# Patient Record
Sex: Male | Born: 1985 | Race: White | Hispanic: No | Marital: Married | Smoking: Current every day smoker
Health system: Southern US, Community
[De-identification: ages and names within clinical notes are randomized; demographics above are authoritative.]

## PROBLEM LIST (undated history)

## (undated) DIAGNOSIS — N200 Calculus of kidney: Secondary | ICD-10-CM

## (undated) DIAGNOSIS — G43909 Migraine, unspecified, not intractable, without status migrainosus: Secondary | ICD-10-CM

## (undated) HISTORY — DX: Migraine, unspecified, not intractable, without status migrainosus: G43.909

## (undated) HISTORY — PX: KNEE SURGERY: SHX244

## (undated) HISTORY — PX: CLAVICLE SURGERY: SHX598

---

## 2007-08-27 ENCOUNTER — Emergency Department: Payer: Self-pay | Admitting: Emergency Medicine

## 2007-09-01 ENCOUNTER — Ambulatory Visit: Payer: Self-pay | Admitting: Unknown Physician Specialty

## 2007-09-05 ENCOUNTER — Ambulatory Visit: Payer: Self-pay | Admitting: Unknown Physician Specialty

## 2010-03-27 ENCOUNTER — Ambulatory Visit: Payer: Self-pay

## 2010-12-16 ENCOUNTER — Ambulatory Visit: Payer: Self-pay | Admitting: Specialist

## 2013-01-14 ENCOUNTER — Emergency Department: Payer: Self-pay | Admitting: Emergency Medicine

## 2013-01-14 LAB — URINALYSIS, COMPLETE
Glucose,UR: 50 mg/dL (ref 0–75)
Ketone: NEGATIVE
Nitrite: NEGATIVE
Protein: NEGATIVE
RBC,UR: 9 /HPF (ref 0–5)
Specific Gravity: 1.015 (ref 1.003–1.030)
Squamous Epithelial: NONE SEEN
WBC UR: 152 /HPF (ref 0–5)

## 2013-01-14 LAB — CBC WITH DIFFERENTIAL/PLATELET
Basophil %: 0.2 %
Eosinophil #: 0 10*3/uL (ref 0.0–0.7)
Eosinophil %: 0.2 %
HGB: 13.9 g/dL (ref 13.0–18.0)
Lymphocyte %: 7.6 %
MCH: 30.5 pg (ref 26.0–34.0)
MCHC: 33.7 g/dL (ref 32.0–36.0)
Neutrophil #: 14.4 10*3/uL — ABNORMAL HIGH (ref 1.4–6.5)
RDW: 13.4 % (ref 11.5–14.5)
WBC: 17.1 10*3/uL — ABNORMAL HIGH (ref 3.8–10.6)

## 2013-01-14 LAB — BASIC METABOLIC PANEL
Calcium, Total: 9.2 mg/dL (ref 8.5–10.1)
Chloride: 102 mmol/L (ref 98–107)
Co2: 30 mmol/L (ref 21–32)
Creatinine: 0.79 mg/dL (ref 0.60–1.30)
EGFR (African American): >60
EGFR (Non-African Amer.): >60
Glucose: 92 mg/dL (ref 65–99)
Osmolality: 268 (ref 275–301)
Sodium: 135 mmol/L — ABNORMAL LOW (ref 136–145)

## 2013-01-14 LAB — GC/CHLAMYDIA PROBE AMP

## 2013-01-16 LAB — URINE CULTURE

## 2013-04-13 ENCOUNTER — Ambulatory Visit (INDEPENDENT_AMBULATORY_CARE_PROVIDER_SITE_OTHER): Payer: PRIVATE HEALTH INSURANCE

## 2013-04-13 ENCOUNTER — Encounter: Payer: Self-pay | Admitting: Podiatry

## 2013-04-13 ENCOUNTER — Ambulatory Visit (INDEPENDENT_AMBULATORY_CARE_PROVIDER_SITE_OTHER): Payer: PRIVATE HEALTH INSURANCE | Admitting: Podiatry

## 2013-04-13 VITALS — BP 130/76 | HR 90 | Resp 16 | Ht 77.0 in | Wt 190.0 lb

## 2013-04-13 DIAGNOSIS — M79609 Pain in unspecified limb: Secondary | ICD-10-CM

## 2013-04-13 DIAGNOSIS — M79676 Pain in unspecified toe(s): Secondary | ICD-10-CM

## 2013-04-13 DIAGNOSIS — M779 Enthesopathy, unspecified: Secondary | ICD-10-CM

## 2013-04-13 NOTE — Progress Notes (Signed)
   Subjective:    Patient ID: Isaiah Lewis, male    DOB: [birth year], 28 y.o.   MRN: [id number]  HPI Comments: This pinky toe left foot. Would act up on and off , and it has a knot on toe . Last week was red and swollen. It has gotten a little better  Toe Pain       Review of Systems  All other systems reviewed and are negative.       Objective:   Physical Exam: I have reviewed his past medical history medications allergies surgeries social history and review of systems. He does have a family history of gout. Pulses are palpable bilateral. Neurologic sensorium is intact bilateral. Deep tendon reflexes are brisk and equal bilateral. Muscle strength is 5 over 5 dorsiflexors plantar flexors inverters everters all intrinsic musculature is intact. Orthopedic evaluation demonstrates a mildly edematous fifth digit of the left foot with tenderness on palpation of the lateral and plantar lateral aspect of the fifth PIPJ left. Radiographic evaluation does demonstrate a soft tissue increase in density at the lateral and plantar lateral PIPJ possibly associated with gout or bursitis.        Assessment & Plan:  Assessment: Gout or bursitis fifth digit of the left foot.  Plan: I offered him an injection today but since it has been getting better he says he rather not take the injection. I will followup with him an as-needed basis. Should he come in complaining of a wrist swollen toe an arthritic profile will be performed.

## 2015-04-03 ENCOUNTER — Emergency Department: Payer: PRIVATE HEALTH INSURANCE

## 2015-04-03 ENCOUNTER — Emergency Department
Admission: EM | Admit: 2015-04-03 | Discharge: 2015-04-03 | Disposition: A | Discharge status: Home / Self Care | Payer: PRIVATE HEALTH INSURANCE | Attending: Emergency Medicine | Admitting: Emergency Medicine

## 2015-04-03 DIAGNOSIS — N200 Calculus of kidney: Secondary | ICD-10-CM | POA: Diagnosis not present

## 2015-04-03 DIAGNOSIS — F17210 Nicotine dependence, cigarettes, uncomplicated: Secondary | ICD-10-CM | POA: Insufficient documentation

## 2015-04-03 DIAGNOSIS — R079 Chest pain, unspecified: Secondary | ICD-10-CM | POA: Insufficient documentation

## 2015-04-03 DIAGNOSIS — R109 Unspecified abdominal pain: Secondary | ICD-10-CM | POA: Diagnosis present

## 2015-04-03 LAB — URINALYSIS COMPLETE WITH MICROSCOPIC (ARMC ONLY)
BILIRUBIN URINE: NEGATIVE
Glucose, UA: 50 mg/dL — AB
Ketones, ur: NEGATIVE mg/dL
LEUKOCYTES UA: NEGATIVE
Nitrite: NEGATIVE
PH: 5 (ref 5.0–8.0)
Protein, ur: NEGATIVE mg/dL
RBC / HPF: NONE SEEN RBC/hpf (ref 0–5)
SPECIFIC GRAVITY, URINE: 1.027 (ref 1.005–1.030)
WBC UA: NONE SEEN WBC/hpf (ref 0–5)

## 2015-04-03 LAB — COMPREHENSIVE METABOLIC PANEL
ALBUMIN: 4.7 g/dL (ref 3.5–5.0)
ALT: 36 U/L (ref 17–63)
AST: 36 U/L (ref 15–41)
Alkaline Phosphatase: 77 U/L (ref 38–126)
Anion gap: 9 (ref 5–15)
BUN: 14 mg/dL (ref 6–20)
CHLORIDE: 107 mmol/L (ref 101–111)
CO2: 25 mmol/L (ref 22–32)
Calcium: 9.2 mg/dL (ref 8.9–10.3)
Creatinine, Ser: 0.94 mg/dL (ref 0.61–1.24)
GFR calc Af Amer: >60 mL/min (ref >60)
GFR calc non Af Amer: >60 mL/min (ref >60)
GLUCOSE: 125 mg/dL — AB (ref 65–99)
POTASSIUM: 3.9 mmol/L (ref 3.5–5.1)
Sodium: 141 mmol/L (ref 135–145)
Total Bilirubin: 0.7 mg/dL (ref 0.3–1.2)
Total Protein: 7.3 g/dL (ref 6.5–8.1)

## 2015-04-03 LAB — CBC
HCT: 45.4 % (ref 40.0–52.0)
Hemoglobin: 15.7 g/dL (ref 13.0–18.0)
MCH: 31.5 pg (ref 26.0–34.0)
MCHC: 34.6 g/dL (ref 32.0–36.0)
MCV: 90.9 fL (ref 80.0–100.0)
PLATELETS: 257 10*3/uL (ref 150–440)
RBC: 5 MIL/uL (ref 4.40–5.90)
RDW: 13.4 % (ref 11.5–14.5)
WBC: 6.6 10*3/uL (ref 3.8–10.6)

## 2015-04-03 MED ORDER — KETOROLAC TROMETHAMINE 30 MG/ML IJ SOLN
30.0000 mg | Freq: Once | INTRAMUSCULAR | Status: AC
  Administered 2015-04-03: 30 mg via INTRAVENOUS
  Filled 2015-04-03: qty 1

## 2015-04-03 MED ORDER — ONDANSETRON HCL 4 MG/2ML IJ SOLN
INTRAMUSCULAR | Status: AC
  Filled 2015-04-03: qty 2

## 2015-04-03 MED ORDER — ONDANSETRON HCL 4 MG/2ML IJ SOLN
4.0000 mg | Freq: Once | INTRAMUSCULAR | Status: AC
  Administered 2015-04-03: 4 mg via INTRAVENOUS

## 2015-04-03 MED ORDER — MORPHINE SULFATE (PF) 4 MG/ML IV SOLN
4.0000 mg | Freq: Once | INTRAVENOUS | Status: AC
  Administered 2015-04-03: 4 mg via INTRAVENOUS

## 2015-04-03 MED ORDER — TAMSULOSIN HCL 0.4 MG PO CAPS: 0.4000 mg | ORAL_CAPSULE | Freq: Every day | ORAL | Status: AC

## 2015-04-03 MED ORDER — ONDANSETRON HCL 4 MG/2ML IJ SOLN
INTRAMUSCULAR | Status: AC
  Administered 2015-04-03: 4 mg via INTRAVENOUS
  Filled 2015-04-03: qty 2

## 2015-04-03 MED ORDER — OXYCODONE-ACETAMINOPHEN 5-325 MG PO TABS
1.0000 | ORAL_TABLET | Freq: Once | ORAL | Status: AC
  Administered 2015-04-03: 1 via ORAL
  Filled 2015-04-03: qty 1

## 2015-04-03 MED ORDER — OXYCODONE-ACETAMINOPHEN 5-325 MG PO TABS: 1.0000 | ORAL_TABLET | Freq: Four times a day (QID) | ORAL | Status: AC | PRN

## 2015-04-03 MED ORDER — SODIUM CHLORIDE 0.9 % IV BOLUS (SEPSIS)
1000.0000 mL | Freq: Once | INTRAVENOUS | Status: AC
  Administered 2015-04-03: 1000 mL via INTRAVENOUS

## 2015-04-03 MED ORDER — MORPHINE SULFATE (PF) 4 MG/ML IV SOLN
INTRAVENOUS | Status: AC
  Administered 2015-04-03: 4 mg via INTRAVENOUS
  Filled 2015-04-03: qty 1

## 2015-04-03 MED ORDER — ONDANSETRON 4 MG PO TBDP: 4.0000 mg | ORAL_TABLET | Freq: Three times a day (TID) | ORAL | Status: AC | PRN

## 2015-04-03 NOTE — Discharge Instructions (Signed)
Kidney Stones °Kidney stones (urolithiasis) are deposits that form inside your kidneys. The intense pain is caused by the stone moving through the urinary tract. When the stone moves, the ureter goes into spasm around the stone. The stone is usually passed in the urine.  °CAUSES  °· A disorder that makes certain neck glands produce too much parathyroid hormone (primary hyperparathyroidism). °· A buildup of uric acid crystals, similar to gout in your joints. °· Narrowing (stricture) of the ureter. °· A kidney obstruction present at birth (congenital obstruction). °· Previous surgery on the kidney or ureters. °· Numerous kidney infections. °SYMPTOMS  °· Feeling sick to your stomach (nauseous). °· Throwing up (vomiting). °· Blood in the urine (hematuria). °· Pain that usually spreads (radiates) to the groin. °· Frequency or urgency of urination. °DIAGNOSIS  °· Taking a history and physical exam. °· Blood or urine tests. °· CT scan. °· Occasionally, an examination of the inside of the urinary bladder (cystoscopy) is performed. °TREATMENT  °· Observation. °· Increasing your fluid intake. °· Extracorporeal shock wave lithotripsy--This is a noninvasive procedure that uses shock waves to break up kidney stones. °· Surgery may be needed if you have severe pain or persistent obstruction. There are various surgical procedures. Most of the procedures are performed with the use of small instruments. Only small incisions are needed to accommodate these instruments, so recovery time is minimized. °The size, location, and chemical composition are all important variables that will determine the proper choice of action for you. Talk to your health care provider to better understand your situation so that you will minimize the risk of injury to yourself and your kidney.  °HOME CARE INSTRUCTIONS  °· Drink enough water and fluids to keep your urine clear or pale yellow. This will help you to pass the stone or stone fragments. °· Strain  all urine through the provided strainer. Keep all particulate matter and stones for your health care provider to see. The stone causing the pain may be as small as a grain of salt. It is very important to use the strainer each and every time you pass your urine. The collection of your stone will allow your health care provider to analyze it and verify that a stone has actually passed. The stone analysis will often identify what you can do to reduce the incidence of recurrences. °· Only take over-the-counter or prescription medicines for pain, discomfort, or fever as directed by your health care provider. °· Keep all follow-up visits as told by your health care provider. This is important. °· Get follow-up X-rays if required. The absence of pain does not always mean that the stone has passed. It may have only stopped moving. If the urine remains completely obstructed, it can cause loss of kidney function or even complete destruction of the kidney. It is your responsibility to make sure X-rays and follow-ups are completed. Ultrasounds of the kidney can show blockages and the status of the kidney. Ultrasounds are not associated with any radiation and can be performed easily in a matter of minutes. °· Make changes to your daily diet as told by your health care provider. You may be told to: °¨ Limit the amount of salt that you eat. °¨ Eat 5 or more servings of fruits and vegetables each day. °¨ Limit the amount of meat, poultry, fish, and eggs that you eat. °· Collect a 24-hour urine sample as told by your health care provider. You may need to collect another urine sample every 6-12   months. °SEEK MEDICAL CARE IF: °· You experience pain that is progressive and unresponsive to any pain medicine you have been prescribed. °SEEK IMMEDIATE MEDICAL CARE IF:  °· Pain cannot be controlled with the prescribed medicine. °· You have a fever or shaking chills. °· The severity or intensity of pain increases over 18 hours and is not  relieved by pain medicine. °· You develop a new onset of abdominal pain. °· You feel faint or pass out. °· You are unable to urinate. °  °This information is not intended to replace advice given to you by your health care provider. Make sure you discuss any questions you have with your health care provider. °  °Document Released: 01/19/2005 Document Revised: 10/10/2014 Document Reviewed: 06/22/2012 °Elsevier Interactive Patient Education ©2016 [employer] ° °Renal Colic °Renal colic is pain that is caused by passing a kidney stone. The pain can be sharp and severe. It may be felt in the back, abdomen, side (flank), or groin. It can cause nausea. Renal colic can come and go. °HOME CARE INSTRUCTIONS °Watch your condition for any changes. The following actions may help to lessen any discomfort that you are feeling: °· Take medicines only as directed by your health care provider. °· Ask your health care provider if it is okay to take over-the-counter pain medicine. °· Drink enough fluid to keep your urine clear or pale yellow. Drink 6-8 glasses of water each day. °· Limit the amount of salt that you eat to less than 2 grams per day. °· Reduce the amount of protein in your diet. Eat less meat, fish, nuts, and dairy. °· Avoid foods such as spinach, rhubarb, nuts, or bran. These may make kidney stones more likely to form. °SEEK MEDICAL CARE IF: °· You have a fever or chills. °· Your urine smells bad or looks cloudy. °· You have pain or burning when you pass urine. °SEEK IMMEDIATE MEDICAL CARE IF: °· Your flank pain or groin pain suddenly worsens. °· You become confused or disoriented or you lose consciousness. °  °This information is not intended to replace advice given to you by your health care provider. Make sure you discuss any questions you have with your health care provider. °  °Document Released: 10/29/2004 Document Revised: 02/09/2014 Document Reviewed: 11/29/2013 °Elsevier Interactive Patient Education ©2016  [employer] ° °

## 2015-04-03 NOTE — ED Provider Notes (Signed)
[facility] Emergency Department Provider Note  ____________________________________________  Time seen: Approximately 639 AM  I have reviewed the triage vital signs and the nursing notes.   HISTORY  Chief Complaint Flank Pain    HPI Isaiah Lewis is a 30 y.o. male who comes into the hospital with some right flank pain. The patient reports he woke up with sharp pain in his right flank and the pain is moving into his groin. The patient denies any blood in his urine or dysuria. According to the patient's significant other he was vomiting as well. The patient has never had this pain before. A year ago he did have prostatitis which resulted in some pain in his groin. He did not have pain in his back. The patient rates his pain 8-9 out of 10 in intensity. He did not take anything for pain at home. The patient reports that on his way here he felt as though he had some chest discomfort as well. The patient was concerned and very uncomfortable she came in for evaluation.   Past Medical History  Diagnosis Date  . Migraines     There are no active problems to display for this patient.   No past surgical history on file.  Current Outpatient Rx  Name  Route  Sig  Dispense  Refill  . ondansetron (ZOFRAN ODT) 4 MG disintegrating tablet   Oral   Take 1 tablet (4 mg total) by mouth every 8 (eight) hours as needed for nausea or vomiting.   20 tablet   0   . oxyCODONE-acetaminophen (ROXICET) 5-325 MG tablet   Oral   Take 1 tablet by mouth every 6 (six) hours as needed.   12 tablet   0   . SUMAtriptan (IMITREX) 100 MG tablet   Oral   Take 100 mg by mouth as needed for migraine or headache. May repeat in 2 hours if headache persists or recurs.         . tamsulosin (FLOMAX) 0.4 MG CAPS capsule   Oral   Take 1 capsule (0.4 mg total) by mouth daily.   7 capsule   0     Allergies Tylenol with codeine #3  No family history on file.  Social  History Social History  Substance Use Topics  . Smoking status: Current Some Day Smoker    Types: Cigarettes  . Smokeless tobacco: Current User  . Alcohol Use: No    Review of Systems Constitutional: No fever/chills Eyes: No visual changes. ENT: No sore throat. Cardiovascular:  chest pain. Respiratory: Denies shortness of breath. Gastrointestinal: Abdominal pain with nausea and vomiting Genitourinary: Negative for dysuria. Musculoskeletal: Right flank pain Skin: Negative for rash. Neurological: Negative for headaches, focal weakness or numbness.  10-point ROS otherwise negative.  ____________________________________________   PHYSICAL EXAM:  VITAL SIGNS: ED Triage Vitals  Enc Vitals Group     BP 04/03/15 0629 109/57 mmHg     Pulse Rate 04/03/15 0629 96     Resp 04/03/15 0629 18     Temp 04/03/15 0629 98.5 F (36.9 C)     Temp Source 04/03/15 0629 Oral     SpO2 04/03/15 0629 98 %     Weight 04/03/15 0629 210 lb (95.255 kg)     Height 04/03/15 0629  (1.956 m)     Head Cir --      Peak Flow --      Pain Score 04/03/15 0630 6     Pain Loc --  Pain Edu? --      Excl. in GC? --     Constitutional: Alert and oriented. Well appearing and in moderate distress. Eyes: Conjunctivae are normal. PERRL. EOMI. Head: Atraumatic. Nose: No congestion/rhinnorhea. Mouth/Throat: Mucous membranes are moist.  Oropharynx non-erythematous. Cardiovascular: Normal rate, regular rhythm. Grossly normal heart sounds.  Good peripheral circulation. Respiratory: Normal respiratory effort.  No retractions. Lungs CTAB. Gastrointestinal: Soft, No distention. Positive bowel sounds right flank pain with some tenderness to the right abdomen Genitourinary: Normal external genitalia with no testicular pain or swelling. Musculoskeletal: No lower extremity tenderness nor edema.   Neurologic:  Normal speech and language.  Skin:  Skin is warm, dry and intact.  Psychiatric: Mood and affect are  normal.   ____________________________________________   LABS (all labs ordered are listed, but only abnormal results are displayed)  Labs Reviewed  COMPREHENSIVE METABOLIC PANEL - Abnormal; Notable for the following:    Glucose, Bld 125 (*)    All other components within normal limits  CBC  URINALYSIS COMPLETEWITH MICROSCOPIC ([facility] ONLY)   ____________________________________________  EKG  None ____________________________________________  RADIOLOGY  CT renal stone study: 1 mm calculus right UVJ with mild hydronephrosis and ureterectasis on the right. No bowel wall thickening or bowel obstruction no abscess appendix appears normal. ____________________________________________   PROCEDURES  Procedure(s) performed: None  Critical Care performed: No  ____________________________________________   INITIAL IMPRESSION / ASSESSMENT AND PLAN / ED COURSE  Pertinent labs & imaging results that were available during my care of the patient were reviewed by me and considered in my medical decision making (see chart for details).  This is a 30 year old male who comes into the hospital today with some right flank pain radiating into his groin. I'm concerned the patient may be having a kidney stone. I will give the patient liter of normal saline as well as some morphine and Zofran. I will send the patient for a non-con CT of his abdomen and pelvis and reassess the patient.  The patient does appear to have a stone on CT scan. I will give him some Toradol as well as some Percocet. His pain did begin to return. His care will be signed out to [facility] who will reassess the patient and follow-up the results of the patient's urinalysis. He will then disposition the patient. ____________________________________________   FINAL CLINICAL IMPRESSION(S) / ED DIAGNOSES  Final diagnoses:  Kidney stone      Rebecka Apley, MD 04/03/15 [phone]

## 2015-04-03 NOTE — ED Notes (Signed)
Pt up to restroom at this time

## 2015-04-03 NOTE — ED Provider Notes (Signed)
Filed Vitals:   04/03/15 0629  BP: 109/57  Pulse: 96  Temp: 98.5 F (36.9 C)  Resp: 18   Patient reports that his pain is much better. Urinalysis does not evident infection. Appears pink control, clinical history appears consistent with a small stone which is likely to pass spontaneously.  Discharge instructions and return precautions discussed the patient is agreeable. He is not driving himself home. He understands not to drive while taking prescription pain medicine.  Return precautions and treatment recommendations and follow-up discussed with the patient who is agreeable with the plan.   Sharyn Creamer, MD 04/03/15 1024

## 2015-04-03 NOTE — ED Notes (Signed)
Pt in with acute onset of right flank pain that started at 0500.

## 2017-12-13 IMAGING — CT CT RENAL STONE PROTOCOL
1 of 2 series · 14 of 32 positions shown, 18 images · non-contrast
Comparison: January 14, 2013

CLINICAL DATA: Right flank pain with nausea for 1 day

EXAM:
CT ABDOMEN AND PELVIS WITHOUT CONTRAST
TECHNIQUE: Multidetector CT imaging of the abdomen and pelvis was performed
following the standard protocol without oral or intravenous contrast
material administration.

[Series 2: stone standard full · axial · 0.69mm/px · z∈[-995,-580]mm · 14 of 95 slices shown, 18 images]
[im 8/95  soft-tissue]
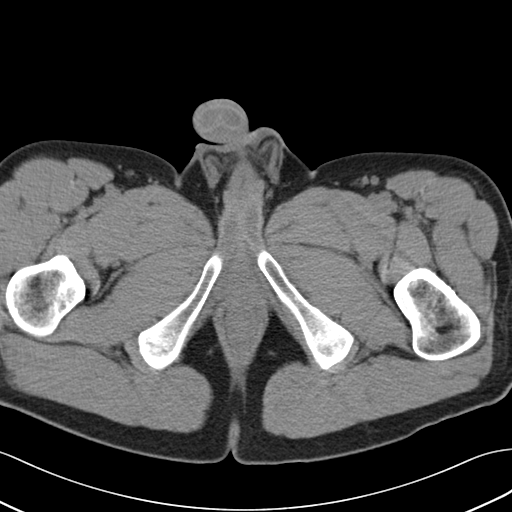
[im 8/95  bone]
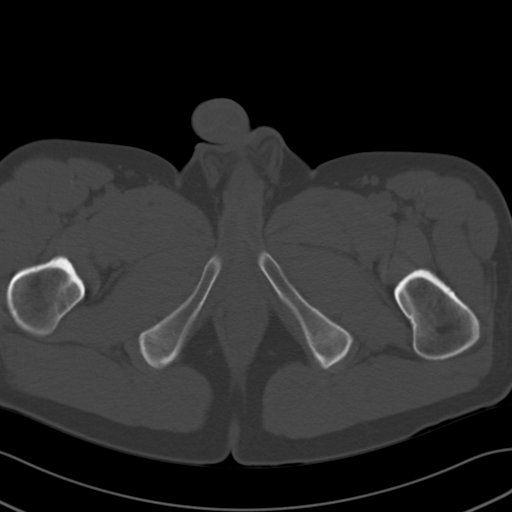
[im 16/95  soft-tissue]
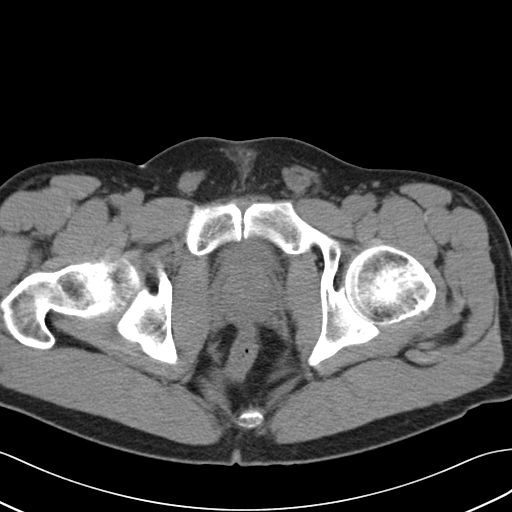
[im 23/95  soft-tissue]
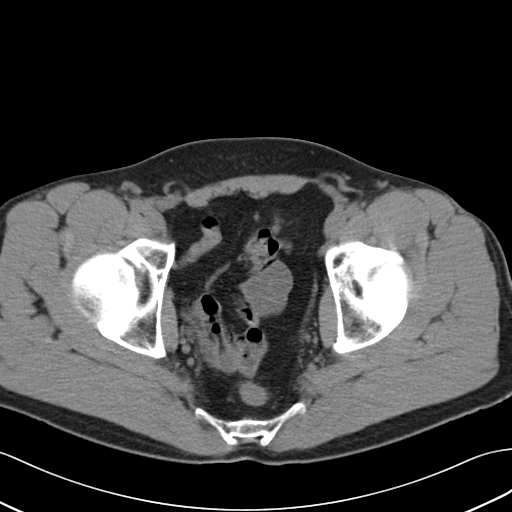
[im 31/95  soft-tissue]
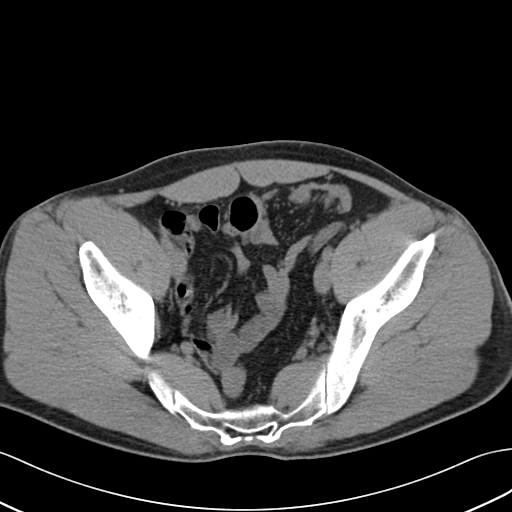
[im 38/95  soft-tissue]
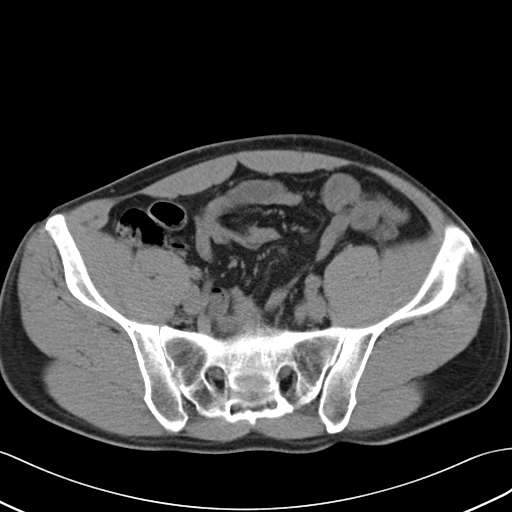
[im 46/95  soft-tissue]
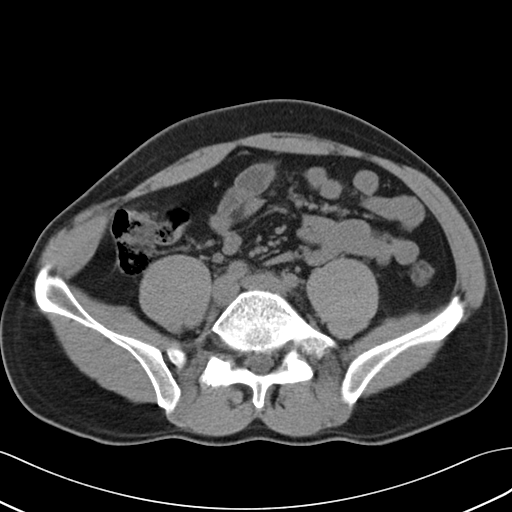
[im 53/95  soft-tissue]
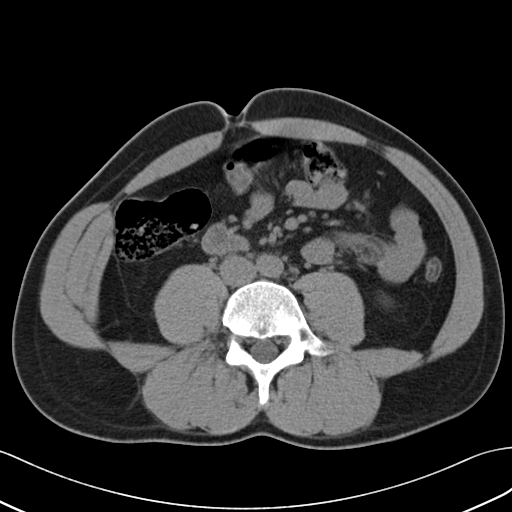
[im 61/95  soft-tissue]
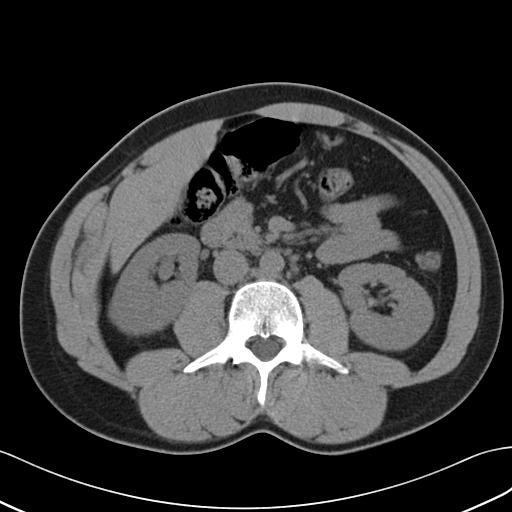
[im 68/95  soft-tissue]
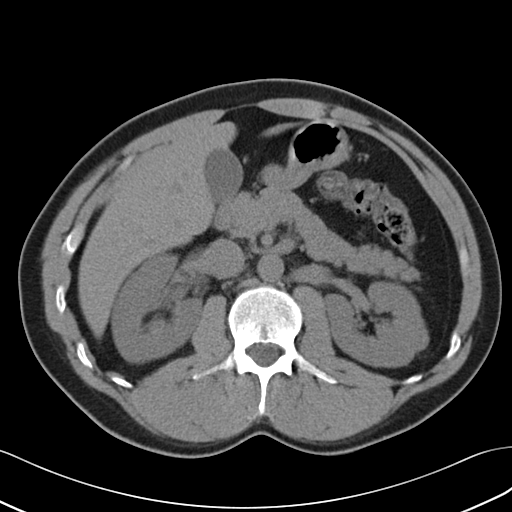
[im 68/95  bone]
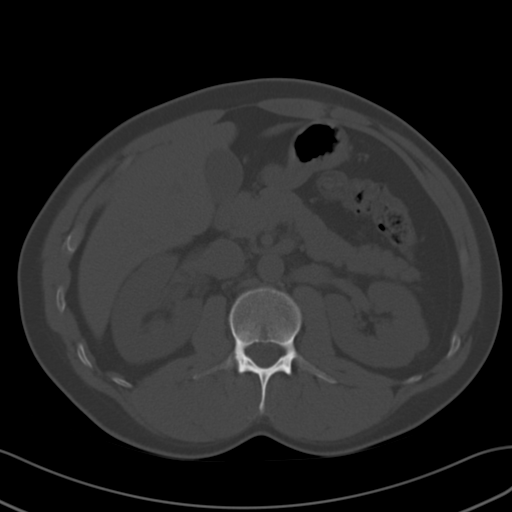
[im 76/95  soft-tissue]
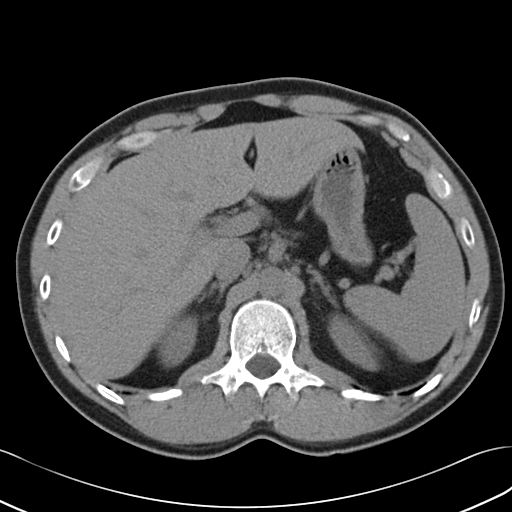
[im 79/95  lung]
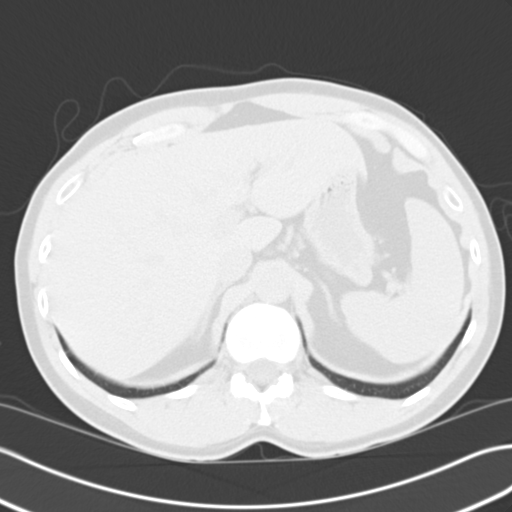
[im 83/95  soft-tissue]
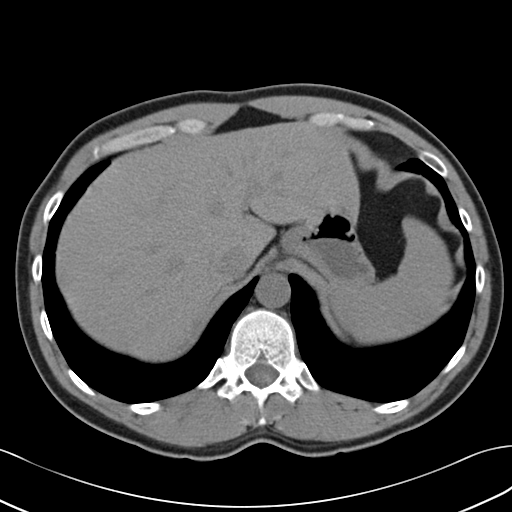
[im 83/95  lung]
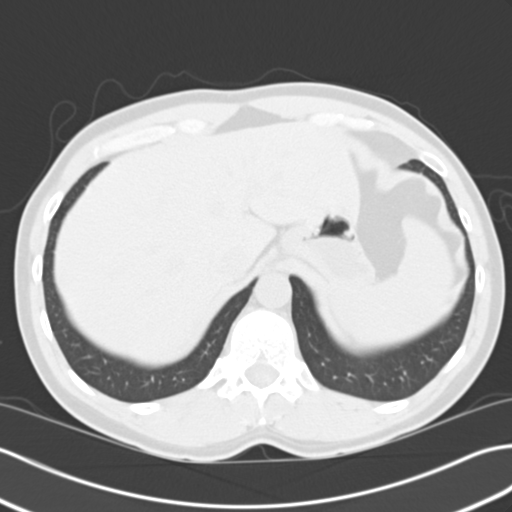
[im 87/95  lung]
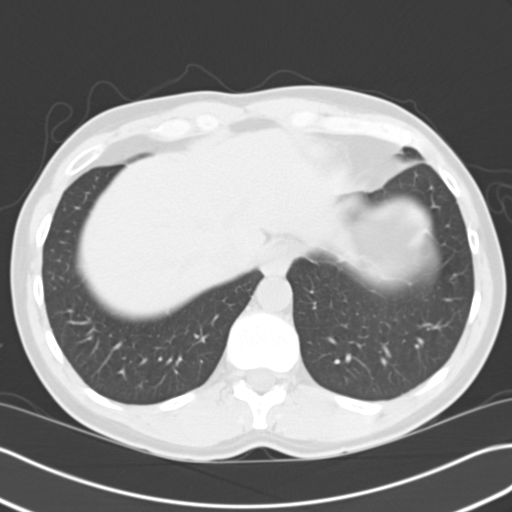
[im 91/95  soft-tissue]
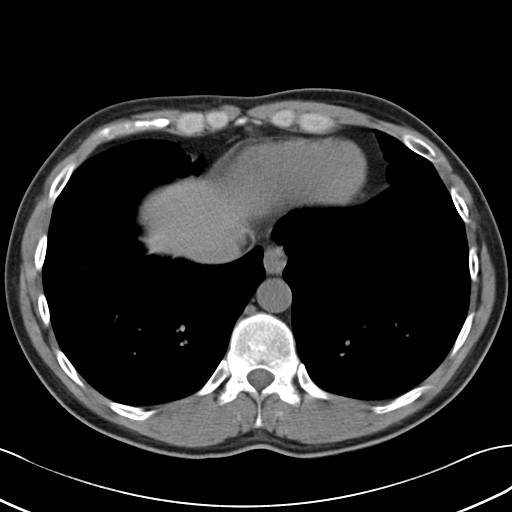
[im 91/95  lung]
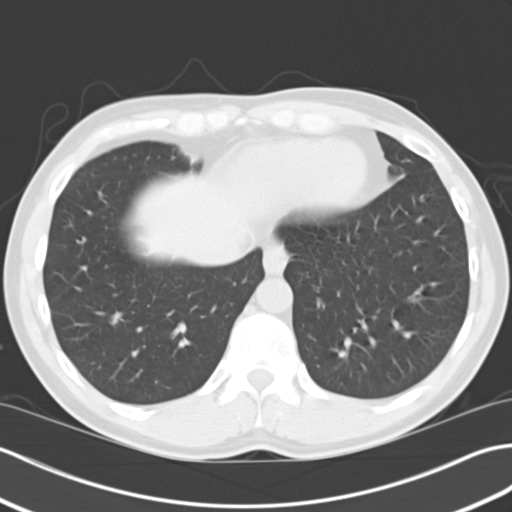

[14 of 32 positions shown; findings below may reference images not displayed]

FINDINGS: Lower chest:  Lung bases are clear.

Hepatobiliary: No focal liver lesions are identified on this
noncontrast enhanced study. The gallbladder wall is not appreciably
thickened. There is no biliary duct dilatation.

Pancreas: No pancreatic mass or inflammatory focus.

Spleen: No splenic lesions are identified.

Adrenals/Urinary Tract: Adrenals appear normal bilaterally. There is
a cyst arising from the upper pole of the left kidney measuring
x 1.3 cm. There is no other renal mass. There is mild hydronephrosis
on the right. There is no hydronephrosis on the left. There is no
intrarenal calculus on either side. On the right, there is a 1 mm
calculus at the level of the right ureterovesical junction. No other
ureteral calculi are identified on either side. The urinary bladder
is decompressed. Urinary bladder wall thickness appears within
normal limits given the degree of urinary bladder or emptying.

Stomach/Bowel: There is no bowel wall or mesenteric thickening. No
bowel obstruction. No free air or portal venous air.

Vascular/Lymphatic: There is no abdominal aortic aneurysm. No
vascular lesions are apparent on this noncontrast enhanced study.
There is a left sided retroaortic renal vein, an anatomic variant.
There is no demonstrable adenopathy in the abdomen or pelvis.

Reproductive: Prostate and seminal vesicles are normal in size and
contour. There are a few prostatic calculi. There is no pelvic mass
or pelvic fluid collection.

Other: Appendix appears normal. No ascites or abscess is apparent in
the abdomen or pelvis. There is fat in each inguinal ring. There is
a minimal ventral hernia containing only fat.

Musculoskeletal: There are no blastic or lytic bone lesions. No
intramuscular or abdominal wall lesions.
IMPRESSION: 1 mm calculus right ureterovesical junction with mild hydronephrosis
and ureterectasis on the right.

No rib intrarenal calculi on either side.

Occasional prostatic calculi.

No bowel wall thickening or bowel obstruction. No abscess. Appendix
appears normal.

## 2019-06-15 ENCOUNTER — Emergency Department: Payer: 59

## 2019-06-15 ENCOUNTER — Emergency Department
Admission: EM | Admit: 2019-06-15 | Discharge: 2019-06-15 | Disposition: A | Discharge status: Home / Self Care | Payer: 59 | Attending: Emergency Medicine | Admitting: Emergency Medicine

## 2019-06-15 ENCOUNTER — Encounter: Payer: Self-pay | Admitting: Emergency Medicine

## 2019-06-15 ENCOUNTER — Other Ambulatory Visit: Payer: Self-pay

## 2019-06-15 DIAGNOSIS — F17210 Nicotine dependence, cigarettes, uncomplicated: Secondary | ICD-10-CM | POA: Diagnosis not present

## 2019-06-15 DIAGNOSIS — Z79899 Other long term (current) drug therapy: Secondary | ICD-10-CM | POA: Diagnosis not present

## 2019-06-15 DIAGNOSIS — N23 Unspecified renal colic: Secondary | ICD-10-CM | POA: Diagnosis not present

## 2019-06-15 DIAGNOSIS — R1031 Right lower quadrant pain: Secondary | ICD-10-CM | POA: Diagnosis present

## 2019-06-15 HISTORY — DX: Calculus of kidney: N20.0

## 2019-06-15 LAB — URINALYSIS, COMPLETE (UACMP) WITH MICROSCOPIC
Bacteria, UA: NONE SEEN
Bilirubin Urine: NEGATIVE
Glucose, UA: NEGATIVE mg/dL
Hgb urine dipstick: NEGATIVE
Ketones, ur: NEGATIVE mg/dL
Leukocytes,Ua: NEGATIVE
Nitrite: NEGATIVE
Protein, ur: 30 mg/dL — AB
Specific Gravity, Urine: 1.025 (ref 1.005–1.030)
Squamous Epithelial / LPF: NONE SEEN (ref 0–5)
pH: 9 — ABNORMAL HIGH (ref 5.0–8.0)

## 2019-06-15 LAB — CBC WITH DIFFERENTIAL/PLATELET
Abs Immature Granulocytes: 0.07 K/uL (ref 0.00–0.07)
Basophils Absolute: 0.1 K/uL (ref 0.0–0.1)
Basophils Relative: 0 %
Eosinophils Absolute: 0.3 K/uL (ref 0.0–0.5)
Eosinophils Relative: 3 %
HCT: 48.2 % (ref 39.0–52.0)
Hemoglobin: 17.3 g/dL — ABNORMAL HIGH (ref 13.0–17.0)
Immature Granulocytes: 1 %
Lymphocytes Relative: 21 %
Lymphs Abs: 2.7 K/uL (ref 0.7–4.0)
MCH: 32.4 pg (ref 26.0–34.0)
MCHC: 35.9 g/dL (ref 30.0–36.0)
MCV: 90.3 fL (ref 80.0–100.0)
Monocytes Absolute: 0.7 K/uL (ref 0.1–1.0)
Monocytes Relative: 6 %
Neutro Abs: 8.7 K/uL — ABNORMAL HIGH (ref 1.7–7.7)
Neutrophils Relative %: 69 %
Platelets: 311 K/uL (ref 150–400)
RBC: 5.34 MIL/uL (ref 4.22–5.81)
RDW: 12.4 % (ref 11.5–15.5)
WBC: 12.5 K/uL — ABNORMAL HIGH (ref 4.0–10.5)
nRBC: 0 % (ref 0.0–0.2)

## 2019-06-15 LAB — COMPREHENSIVE METABOLIC PANEL WITH GFR
ALT: 70 U/L — ABNORMAL HIGH (ref 0–44)
AST: 48 U/L — ABNORMAL HIGH (ref 15–41)
Albumin: 5 g/dL (ref 3.5–5.0)
Alkaline Phosphatase: 90 U/L (ref 38–126)
Anion gap: 9 (ref 5–15)
BUN: 15 mg/dL (ref 6–20)
CO2: 25 mmol/L (ref 22–32)
Calcium: 9.5 mg/dL (ref 8.9–10.3)
Chloride: 104 mmol/L (ref 98–111)
Creatinine, Ser: 1.11 mg/dL (ref 0.61–1.24)
GFR calc Af Amer: >60 mL/min (ref >60)
GFR calc non Af Amer: >60 mL/min (ref >60)
Glucose, Bld: 138 mg/dL — ABNORMAL HIGH (ref 70–99)
Potassium: 4.1 mmol/L (ref 3.5–5.1)
Sodium: 138 mmol/L (ref 135–145)
Total Bilirubin: 0.7 mg/dL (ref 0.3–1.2)
Total Protein: 8 g/dL (ref 6.5–8.1)

## 2019-06-15 MED ORDER — KETOROLAC TROMETHAMINE 10 MG PO TABS: 10.0000 mg | ORAL_TABLET | Freq: Four times a day (QID) | ORAL | 0 refills | Status: AC | PRN

## 2019-06-15 NOTE — ED Provider Notes (Signed)
[facility] Emergency Department Provider Note  ____________________________________________  Time seen: Approximately 9:45 AM  I have reviewed the triage vital signs and the nursing notes.   HISTORY  Chief Complaint Flank Pain and Urinary Retention    HPI Isaiah Lewis is a 34 y.o. male with a history of kidney stones who comes ED complaining of right flank pain radiating around to the right lower quadrant and right groin with urinary urgency.  No hematuria.  Started at 3 AM, constant, severe without aggravating or alleviating factors.  Now since arrival to the ED, pain has significantly improved and now about 3/10.  Has not taken anything for pain control so far today.  Reports a history of kidney stones, his father also has kidney stones.  No fevers chills chest pain shortness of breath diarrhea vomiting.   No scrotal swelling or penile discharge.    Past Medical History:  Diagnosis Date  . Kidney stones   . Migraines      There are no problems to display for this patient.    Past Surgical History:  Procedure Laterality Date  . CLAVICLE SURGERY    . KNEE SURGERY       Prior to Admission medications   Medication Sig Start Date End Date Taking? Authorizing Provider  ketorolac (TORADOL) 10 MG tablet Take 1 tablet (10 mg total) by mouth every 6 (six) hours as needed for moderate pain. 06/15/19   Sharman Cheek, MD  ondansetron (ZOFRAN ODT) 4 MG disintegrating tablet Take 1 tablet (4 mg total) by mouth every 8 (eight) hours as needed for nausea or vomiting. 04/03/15   Rebecka Apley, MD  oxyCODONE-acetaminophen (ROXICET) 5-325 MG tablet Take 1 tablet by mouth every 6 (six) hours as needed. 04/03/15   Rebecka Apley, MD  SUMAtriptan (IMITREX) 100 MG tablet Take 100 mg by mouth as needed for migraine or headache. May repeat in 2 hours if headache persists or recurs.    [provider]  tamsulosin (FLOMAX) 0.4 MG CAPS capsule Take 1  capsule (0.4 mg total) by mouth daily. 04/03/15   Rebecka Apley, MD     Allergies Tylenol with codeine #3 [acetaminophen-codeine]   No family history on file.  Social History Social History   Tobacco Use  . Smoking status: Current Every Day Smoker    Types: Cigarettes  . Smokeless tobacco: Current User  Substance Use Topics  . Alcohol use: Yes  . Drug use: No    Review of Systems  Constitutional:   No fever or chills.  ENT:   No sore throat. No rhinorrhea. Cardiovascular:   No chest pain or syncope. Respiratory:   No dyspnea or cough. Gastrointestinal:   Negative for abdominal pain, vomiting and diarrhea.  Positive right flank pain Musculoskeletal:   Negative for focal pain or swelling All other systems reviewed and are negative except as documented above in ROS and HPI.  ____________________________________________   PHYSICAL EXAM:  VITAL SIGNS: ED Triage Vitals  Enc Vitals Group     BP 06/15/19 0631 130/84     Pulse Rate 06/15/19 0631 60     Resp 06/15/19 0631 18     Temp 06/15/19 0631 97.6 F (36.4 C)     Temp Source 06/15/19 0631 Oral     SpO2 06/15/19 0631 99 %     Weight 06/15/19 0632 235 lb (106.6 kg)     Height 06/15/19 0632 6\' 5"  (1.956 m)     Head Circumference --  Peak Flow --      Pain Score 06/15/19 0632 7     Pain Loc --      Pain Edu? --      Excl. in GC? --     Vital signs reviewed, nursing assessments reviewed.   Constitutional:   Alert and oriented. Non-toxic appearance. Eyes:   Conjunctivae are normal. EOMI. PERRL. ENT      Head:   Normocephalic and atraumatic.      Nose:   Wearing a mask.      Mouth/Throat:   Wearing a mask.      Neck:   No meningismus. Full ROM. Hematological/Lymphatic/Immunilogical:   No cervical lymphadenopathy. Cardiovascular:   RRR. Symmetric bilateral radial and DP pulses.  No murmurs. Cap refill less than 2 seconds. Respiratory:   Normal respiratory effort without tachypnea/retractions. Breath  sounds are clear and equal bilaterally. No wheezes/rales/rhonchi. Gastrointestinal:   Soft and nontender. Non distended. There is no CVA tenderness.  No rebound, rigidity, or guarding. Musculoskeletal:   Normal range of motion in all extremities. No joint effusions.  No lower extremity tenderness.  No edema. Neurologic:   Normal speech and language.  Motor grossly intact. No acute focal neurologic deficits are appreciated.  Skin:    Skin is warm, dry and intact. No rash noted.  No petechiae, purpura, or bullae.  ____________________________________________    LABS (pertinent positives/negatives) (all labs ordered are listed, but only abnormal results are displayed) Labs Reviewed  CBC WITH DIFFERENTIAL/PLATELET - Abnormal; Notable for the following components:      Result Value   WBC 12.5 (*)    Hemoglobin 17.3 (*)    Neutro Abs 8.7 (*)    All other components within normal limits  COMPREHENSIVE METABOLIC PANEL - Abnormal; Notable for the following components:   Glucose, Bld 138 (*)    AST 48 (*)    ALT 70 (*)    All other components within normal limits  URINALYSIS, COMPLETE (UACMP) WITH MICROSCOPIC - Abnormal; Notable for the following components:   Color, Urine YELLOW (*)    APPearance HAZY (*)    pH 9.0 (*)    Protein, ur 30 (*)    All other components within normal limits   ____________________________________________   EKG    ____________________________________________    RADIOLOGY  CT Renal Stone Study  Result Date: 06/15/2019 CLINICAL DATA:  Flank pain, kidney stone suspected. Additional provided: Acute right lower quadrant pain, history of kidney stones. EXAM: CT ABDOMEN AND PELVIS WITHOUT CONTRAST TECHNIQUE: Multidetector CT imaging of the abdomen and pelvis was performed following the standard protocol without IV contrast. COMPARISON:  CT abdomen/pelvis 04/03/2015 FINDINGS: LOWER CHEST: The imaged lungs are clear.The visualized heart is normal in size.  HEPATOBILIARY: No evidence of focal liver lesion.The common duct is normal in caliber. PANCREAS: No evidence of focal lesion.No ductal dilatation or peripancreatic edema. SPLEEN: No evidence of focal lesion.No splenomegaly. ADRENALS/URINARY TRACT: No adrenal gland mass. No hydronephrosis or renal calculus. 14 mm left upper pole renal cyst. There is mild asymmetric prominence of the right ureter with subtle surrounding inflammatory stranding. This may be secondary to recently passed stone. No ureteral calculus is identified. There is a 3 mm calculus within the bladder at the base (series 6, image 107). The urinary bladder is otherwise unremarkable for the degree of distension. STOMACH/BOWEL: The stomach is unremarkable.No dilated loops of bowel.No appreciable inflammation. Normal appendix. VASCULAR/LYMPHATIC: No abdominal aortic aneurysm. No lymphadenopathy. REPRODUCTIVE: Redemonstrated prostatic calculi. OTHER: No ascites.The  body wall is normal. MUSCULOSKELETAL: No acute bony abnormality or aggressive osseous lesion. IMPRESSION: Mild asymmetric prominence of the right ureter with subtle surrounding inflammatory stranding. This is suspected related to a calculus having recently passed into the bladder as there is a 3 mm calculus present within the bladder base. No renal or ureteral calculi are demonstrated. Unchanged 14 mm left renal cyst. Redemonstrated prostatic calculi. Electronically Signed   By: Kellie Simmering DO   On: 06/15/2019 09:19    ____________________________________________   PROCEDURES Procedures  ____________________________________________  DIFFERENTIAL DIAGNOSIS   Appendicitis, ureterolithiasis, hydronephrosis, cystitis  CLINICAL IMPRESSION / ASSESSMENT AND PLAN / ED COURSE  Medications ordered in the ED: Medications - No data to display  Pertinent labs & imaging results that were available during my care of the patient were reviewed by me and considered in my medical decision  making (see chart for details).  Isaiah Lewis was evaluated in Emergency Department on 06/15/2019 for the symptoms described in the history of present illness. He was evaluated in the context of the global COVID-19 pandemic, which necessitated consideration that the patient might be at risk for infection with the SARS-CoV-2 virus that causes COVID-19. Institutional protocols and algorithms that pertain to the evaluation of patients at risk for COVID-19 are in a state of rapid change based on information released by regulatory bodies including the CDC and federal and state organizations. These policies and algorithms were followed during the patient's care in the ED.   Patient presents with right flank pain, most likely renal colic.  Vital signs are normal, exam is benign and reassuring, pain is improving.  Labs normal with normal creatinine, normal urinalysis without signs of infection or hematuria.  CT scan shows a 3 mm stone in the bladder consistent with a recently passed ureterolithiasis.  Cannot take NSAIDs for now for pain control, expect that he will return to normal over the next 1 to 2 days.  Recommend hydration, counseled on hydration and dietary modifications to try and prevent future kidney stones.      ____________________________________________   FINAL CLINICAL IMPRESSION(S) / ED DIAGNOSES    Final diagnoses:  Ureteral colic     ED Discharge Orders         Ordered    ketorolac (TORADOL) 10 MG tablet  Every 6 hours PRN     06/15/19 0945          Portions of this note were generated with dragon dictation software. Dictation errors may occur despite best attempts at proofreading.   Carrie Mew, MD 06/15/19 [phone]

## 2019-06-15 NOTE — ED Triage Notes (Signed)
Pt presents to ED with right sided flank pain that is now radiating into his groin and difficulty urinating. Onset of symptoms 3am. Hx of kidney stones. vomiting X 1 this morning.
